# Patient Record
Sex: Female | Born: 1980 | Race: White | Hispanic: No | Marital: Single | Smoking: Never smoker
Health system: Southern US, Community
[De-identification: ages and names within clinical notes are randomized; demographics above are authoritative.]

## PROBLEM LIST (undated history)

## (undated) ENCOUNTER — Inpatient Hospital Stay (HOSPITAL_COMMUNITY): Payer: Self-pay

## (undated) DIAGNOSIS — R011 Cardiac murmur, unspecified: Secondary | ICD-10-CM

## (undated) DIAGNOSIS — N760 Acute vaginitis: Secondary | ICD-10-CM

## (undated) DIAGNOSIS — B9689 Other specified bacterial agents as the cause of diseases classified elsewhere: Secondary | ICD-10-CM

## (undated) HISTORY — PX: NO PAST SURGERIES: SHX2092

---

## 2014-08-28 ENCOUNTER — Emergency Department (HOSPITAL_COMMUNITY): Payer: Self-pay

## 2014-08-28 ENCOUNTER — Encounter (HOSPITAL_COMMUNITY): Payer: Self-pay | Admitting: Emergency Medicine

## 2014-08-28 ENCOUNTER — Emergency Department (HOSPITAL_COMMUNITY)
Admission: EM | Admit: 2014-08-28 | Discharge: 2014-08-29 | Disposition: A | Discharge status: Home / Self Care | Payer: Self-pay | Attending: Emergency Medicine | Admitting: Emergency Medicine

## 2014-08-28 DIAGNOSIS — B373 Candidiasis of vulva and vagina: Secondary | ICD-10-CM

## 2014-08-28 DIAGNOSIS — R000 Tachycardia, unspecified: Secondary | ICD-10-CM | POA: Insufficient documentation

## 2014-08-28 DIAGNOSIS — B379 Candidiasis, unspecified: Secondary | ICD-10-CM | POA: Insufficient documentation

## 2014-08-28 DIAGNOSIS — Z79899 Other long term (current) drug therapy: Secondary | ICD-10-CM | POA: Insufficient documentation

## 2014-08-28 DIAGNOSIS — B3731 Acute candidiasis of vulva and vagina: Secondary | ICD-10-CM

## 2014-08-28 DIAGNOSIS — B9689 Other specified bacterial agents as the cause of diseases classified elsewhere: Secondary | ICD-10-CM

## 2014-08-28 DIAGNOSIS — Z3202 Encounter for pregnancy test, result negative: Secondary | ICD-10-CM | POA: Insufficient documentation

## 2014-08-28 DIAGNOSIS — N760 Acute vaginitis: Secondary | ICD-10-CM | POA: Insufficient documentation

## 2014-08-28 DIAGNOSIS — N939 Abnormal uterine and vaginal bleeding, unspecified: Secondary | ICD-10-CM

## 2014-08-28 LAB — CBC
HCT: 41.7 % (ref 36.0–46.0)
Hemoglobin: 14.3 g/dL (ref 12.0–15.0)
MCH: 30.5 pg (ref 26.0–34.0)
MCHC: 34.3 g/dL (ref 30.0–36.0)
MCV: 88.9 fL (ref 78.0–100.0)
Platelets: 280 10*3/uL (ref 150–400)
RBC: 4.69 MIL/uL (ref 3.87–5.11)
RDW: 13.1 % (ref 11.5–15.5)
WBC: 10.6 10*3/uL — ABNORMAL HIGH (ref 4.0–10.5)

## 2014-08-28 LAB — URINE MICROSCOPIC-ADD ON

## 2014-08-28 LAB — WET PREP, GENITAL: TRICH WET PREP: NONE SEEN

## 2014-08-28 LAB — URINALYSIS, ROUTINE W REFLEX MICROSCOPIC
BILIRUBIN URINE: NEGATIVE
GLUCOSE, UA: NEGATIVE mg/dL
Hgb urine dipstick: NEGATIVE
KETONES UR: NEGATIVE mg/dL
NITRITE: NEGATIVE
PH: 6.5 (ref 5.0–8.0)
Protein, ur: NEGATIVE mg/dL
Specific Gravity, Urine: 1.033 — ABNORMAL HIGH (ref 1.005–1.030)
Urobilinogen, UA: 1 mg/dL (ref 0.0–1.0)

## 2014-08-28 LAB — BASIC METABOLIC PANEL
ANION GAP: 8 (ref 5–15)
BUN: 16 mg/dL (ref 6–23)
CALCIUM: 9.2 mg/dL (ref 8.4–10.5)
CO2: 22 mmol/L (ref 19–32)
CREATININE: 0.78 mg/dL (ref 0.50–1.10)
Chloride: 106 mEq/L (ref 96–112)
GFR calc Af Amer: >90 mL/min (ref >90)
GFR calc non Af Amer: >90 mL/min (ref >90)
GLUCOSE: 98 mg/dL (ref 70–99)
Potassium: 3.4 mmol/L — ABNORMAL LOW (ref 3.5–5.1)
SODIUM: 136 mmol/L (ref 135–145)

## 2014-08-28 LAB — I-STAT BETA HCG BLOOD, ED (MC, WL, AP ONLY): I-stat hCG, quantitative: <5 m[IU]/mL (ref <5)

## 2014-08-28 LAB — ABO/RH: ABO/RH(D): O NEG

## 2014-08-28 LAB — POC URINE PREG, ED: PREG TEST UR: NEGATIVE

## 2014-08-28 MED ORDER — FLUCONAZOLE 200 MG PO TABS
200.0000 mg | ORAL_TABLET | Freq: Once | ORAL | Status: AC
  Administered 2014-08-29: 200 mg via ORAL
  Filled 2014-08-28: qty 1

## 2014-08-28 MED ORDER — METRONIDAZOLE 1.3 % VA GEL: 5.0000 g | Freq: Every day | VAGINAL | Status: DC

## 2014-08-28 NOTE — ED Notes (Addendum)
Pt states she light cramping to her lower abd. Pt states since Saturday she has vaginal discharge when wiping. The discharge color changes at times from pink, red, to brown. Denies nausea, vomiting, or fever.

## 2014-08-28 NOTE — Discharge Instructions (Signed)
1. Medications: metronidazole, usual home medications 2. Treatment: rest, drink plenty of fluids,  3. Follow Up: Please followup with your primary doctor or the listed OB/GYN in 3 days for discussion of your diagnoses and further evaluation after today's visit; if you do not have a primary care doctor use the resource guide provided to find one; Please return to the ER for worsening symptoms   Monilial Vaginitis Vaginitis in a soreness, swelling and redness (inflammation) of the vagina and vulva. Monilial vaginitis is not a sexually transmitted infection. CAUSES  Yeast vaginitis is caused by yeast (candida) that is normally found in your vagina. With a yeast infection, the candida has overgrown in number to a point that upsets the chemical balance. SYMPTOMS   White, thick vaginal discharge.  Swelling, itching, redness and irritation of the vagina and possibly the lips of the vagina (vulva).  Burning or painful urination.  Painful intercourse. DIAGNOSIS  Things that may contribute to monilial vaginitis are:  Postmenopausal and virginal states.  Pregnancy.  Infections.  Being tired, sick or stressed, especially if you had monilial vaginitis in the past.  Diabetes. Good control will help lower the chance.  Birth control pills.  Tight fitting garments.  Using bubble bath, feminine sprays, douches or deodorant tampons.  Taking certain medications that kill germs (antibiotics).  Sporadic recurrence can occur if you become ill. TREATMENT  Your caregiver will give you medication.  There are several kinds of anti monilial vaginal creams and suppositories specific for monilial vaginitis. For recurrent yeast infections, use a suppository or cream in the vagina 2 times a week, or as directed.  Anti-monilial or steroid cream for the itching or irritation of the vulva may also be used. Get your caregiver's permission.  Painting the vagina with methylene blue solution may help if the  monilial cream does not work.  Eating yogurt may help prevent monilial vaginitis. HOME CARE INSTRUCTIONS   Finish all medication as prescribed.  Do not have sex until treatment is completed or after your caregiver tells you it is okay.  Take warm sitz baths.  Do not douche.  Do not use tampons, especially scented ones.  Wear cotton underwear.  Avoid tight pants and panty hose.  Tell your sexual partner that you have a yeast infection. They should go to their caregiver if they have symptoms such as mild rash or itching.  Your sexual partner should be treated as well if your infection is difficult to eliminate.  Practice safer sex. Use condoms.  Some vaginal medications cause latex condoms to fail. Vaginal medications that harm condoms are:  Cleocin cream.  Butoconazole (Femstat).  Terconazole (Terazol) vaginal suppository.  Miconazole (Monistat) (may be purchased over the counter). SEEK MEDICAL CARE IF:   You have a temperature by mouth above 102 F (38.9 C).  The infection is getting worse after 2 days of treatment.  The infection is not getting better after 3 days of treatment.  You develop blisters in or around your vagina.  You develop vaginal bleeding, and it is not your menstrual period.  You have pain when you urinate.  You develop intestinal problems.  You have pain with sexual intercourse. Document Released: 05/06/2005 Document Revised: 10/19/2011 Document Reviewed: 01/18/2009 [facility] Patient Information 2015 [place], [place]. This information is not intended to replace advice given to you by your health care provider. Make sure you discuss any questions you have with your health care provider.

## 2014-08-28 NOTE — ED Notes (Signed)
MD at bedside. 

## 2014-08-28 NOTE — ED Notes (Signed)
Pt states she took a pregnancy test this morning and it resulted positive  Pt states today she has had faint pink discharge from her vagina  Pt states she had the same a few days ago  Pt states she has chronic BV and pt states she is RH negative

## 2014-08-28 NOTE — ED Provider Notes (Signed)
CSN: [id number]     Arrival date & time 08/28/14  2137 History   First MD Initiated Contact with Patient 08/28/14 2201     Chief Complaint  Patient presents with  . Vaginal Bleeding     (Consider location/radiation/quality/duration/timing/severity/associated sxs/prior Treatment) The history is provided by the patient and medical records. No language interpreter was used.     Tammie Stephens is a 34 y.o. female  [phone] with no major medical history presents to the Emergency Department complaining of gradual, persistent, progressively worsening pink discharge onset this morning. Patient reports she has had similar symptoms intermittently for the last 3 days.. She reports that she took a pregnancy test this morning that resulted positive.   LMP: December 27th 2015 and was lighter than usual. Patient also reports that she has chronic bacterial vaginosis which waxes and wanes and occasionally causes vaginal bleeding. She reports that she has been using new scented body wash for several weeks and has noticed an increase in her vaginal discharge.  Patient denies abdominal pain, nausea, vomiting.     History reviewed. No pertinent past medical history. History reviewed. No pertinent past surgical history. History reviewed. No pertinent family history. History  Substance Use Topics  . Smoking status: Never Smoker   . Smokeless tobacco: Not on file  . Alcohol Use: No   OB History    No data available     Review of Systems  Constitutional: Negative for fever, diaphoresis, appetite change, fatigue and unexpected weight change.  HENT: Negative for mouth sores.   Eyes: Negative for visual disturbance.  Respiratory: Negative for cough, chest tightness, shortness of breath and wheezing.   Cardiovascular: Negative for chest pain.  Gastrointestinal: Negative for nausea, vomiting, abdominal pain, diarrhea and constipation.  Endocrine: Negative for polydipsia, polyphagia and polyuria.   Genitourinary: Positive for vaginal bleeding and vaginal discharge. Negative for dysuria, urgency, frequency and hematuria.  Musculoskeletal: Negative for back pain and neck stiffness.  Skin: Negative for rash.  Allergic/Immunologic: Negative for immunocompromised state.  Neurological: Negative for syncope, light-headedness and headaches.  Hematological: Does not bruise/bleed easily.  Psychiatric/Behavioral: Negative for sleep disturbance. The patient is not nervous/anxious.       Allergies  Review of patient's allergies indicates no known allergies.  Home Medications   Prior to Admission medications   Medication Sig Start Date End Date Taking? Authorizing Provider  Ascorbic Acid (VITAMIN C) 1000 MG tablet Take 1,000 mg by mouth daily.   Yes Historical Provider, MD  MetroNIDAZOLE 1.3 % GEL Place 5 g vaginally daily. 08/28/14   Hoby Kawai, PA-C   BP 129/79 mmHg  Pulse 103  Temp(Src) 98 F (36.7 C) (Oral)  Resp 18  SpO2 98%  LMP 07/31/2014 (Exact Date) Physical Exam  Constitutional: She appears well-developed and well-nourished. No distress.  Awake, alert, nontoxic appearance  HENT:  Head: Normocephalic and atraumatic.  Mouth/Throat: Oropharynx is clear and moist. No oropharyngeal exudate.  Eyes: Conjunctivae are normal. No scleral icterus.  Neck: Normal range of motion. Neck supple.  Cardiovascular: Regular rhythm, normal heart sounds and intact distal pulses.  Tachycardia present.   No murmur heard. Pulses:      Radial pulses are 2+ on the right side, and 2+ on the left side.  tachycardia  Pulmonary/Chest: Effort normal and breath sounds normal. No respiratory distress. She has no wheezes.  Equal chest expansion  Abdominal: Soft. Bowel sounds are normal. She exhibits no mass. There is no tenderness. There is no rebound and no guarding.  Hernia confirmed negative in the right inguinal area and confirmed negative in the left inguinal area.  Genitourinary: Uterus  normal. No labial fusion. There is no rash, tenderness or lesion on the right labia. There is no rash, tenderness or lesion on the left labia. Uterus is not deviated, not enlarged, not fixed and not tender. Cervix exhibits friability. Cervix exhibits no motion tenderness and no discharge. Right adnexum displays no mass, no tenderness and no fullness. Left adnexum displays no mass, no tenderness and no fullness. No erythema, tenderness or bleeding in the vagina. No foreign body around the vagina. No signs of injury around the vagina. Vaginal discharge ( thick, white, small, nonodorous) found.  Friability of the cervix with small amount of vaginal bleeding from the cervical os Cervical os appears closed No cervical motion tenderness  Musculoskeletal: Normal range of motion. She exhibits no edema.  Lymphadenopathy:       Right: No inguinal adenopathy present.       Left: No inguinal adenopathy present.  Neurological: She is alert.  Speech is clear and goal oriented Moves extremities without ataxia  Skin: Skin is warm and dry. She is not diaphoretic. No erythema.  Psychiatric: She has a normal mood and affect.  Nursing note and vitals reviewed.   ED Course  Procedures (including critical care time) Labs Review Labs Reviewed  WET PREP, GENITAL - Abnormal; Notable for the following:    Yeast Wet Prep HPF POC FEW (*)    Clue Cells Wet Prep HPF POC RARE (*)    WBC, Wet Prep HPF POC RARE (*)    All other components within normal limits  CBC - Abnormal; Notable for the following:    WBC 10.6 (*)    All other components within normal limits  BASIC METABOLIC PANEL - Abnormal; Notable for the following:    Potassium 3.4 (*)    All other components within normal limits  URINALYSIS, ROUTINE W REFLEX MICROSCOPIC - Abnormal; Notable for the following:    APPearance CLOUDY (*)    Specific Gravity, Urine 1.033 (*)    Leukocytes, UA MODERATE (*)    All other components within normal limits  URINE  MICROSCOPIC-ADD ON - Abnormal; Notable for the following:    Squamous Epithelial / LPF FEW (*)    Bacteria, UA MANY (*)    All other components within normal limits  POC URINE PREG, ED  I-STAT BETA HCG BLOOD, ED ([facility], [facility], [facility] ONLY)  ABO/RH  GC/CHLAMYDIA PROBE AMP ([place])    Imaging Review No results found.   EKG Interpretation None      MDM   Final diagnoses:  Vaginal bleeding  Vaginal spotting  Candidal vaginitis  BV (bacterial vaginosis)   Tammie Stephens presents with vaginal bleeding and positive home pregnancy test.  Pelvic exam with thick, clumping vaginal discharge consistent with yeast.  Cervix is friable and small amount of blood coming from the cervical os. Cervical os is closed. Will obtain labs and ultrasound.  1045:PM HCG negative. No need for ultrasound.  11:56 PM Patient pregnancy test negative. Both urine and blood. Discussed very low likelihood that patient may still be pregnant and the requirement for follow-up in 2 days at OB/GYN for repeat hCG.  Patient now abdominal pain in her abdomen is soft and nontender.  Labs are reassuring. Mild leukocytosis at 10.6. Wet prep with yeast and clue cells. We'll treat for yeast and BV as patient reports her symptoms are consistent with previous episodes of  BV. No evidence of STD and patient reports she is not at risk.  I have personally reviewed patient's vitals, nursing note and any pertinent labs or imaging.  I performed an undressed physical exam.    It has been determined that no acute conditions requiring further emergency intervention are present at this time. The patient/guardian have been advised of the diagnosis and plan. I reviewed all labs and imaging including any potential incidental findings. We have discussed signs and symptoms that warrant return to the ED and they are listed in the discharge instructions.    Vital signs are stable at discharge.   BP 129/79 mmHg  Pulse 103  Temp(Src) 98 F (36.7  C) (Oral)  Resp 18  SpO2 98%  LMP 07/31/2014 (Exact Date)         Dierdre Forth, PA-C 08/29/14 0003  Mirian Mo, MD 08/31/14 [phone]

## 2014-08-29 LAB — GC/CHLAMYDIA PROBE AMP (~~LOC~~) NOT AT ARMC
CHLAMYDIA, DNA PROBE: NEGATIVE
Neisseria Gonorrhea: NEGATIVE

## 2014-09-02 NOTE — Progress Notes (Signed)
09/02/2014 1609 p.m. Tammie Kluth SPARKS,Tammie Stephens,Tammie Stephens     EDCM called Robin back at [facility] at [phone] and gave verbal orders from Dr. Juleen ChinaKohut to change Metrodinazole cream to 0.75%. No further needs identified.  09/02/2014 1602 p.m. Tammie Sulewski SPARKS,Tammie Stephens,Tammie Stephens      Spoke with Dr. Juleen ChinaKohut regarding discharge orders of Metrodinazole 1.3% and [facility] request to change strength to 0.75%. Gave verbal orders to change Metrodinazole to 0.75% as requested.  09/02/2014 1553 p.m. Tammie Das SPARKS,Tammie Stephens,Tammie Stephens      Received call from Zella BallRobin at [facility] [phone]) regarding discharge instructions for Metrodinazole 1.3 %. States, "that strength is not an option, we only give 0.75%. Could we get an order to change it?" EDCM to talk with EDP and call [facility] back.

## 2015-11-02 ENCOUNTER — Encounter (HOSPITAL_COMMUNITY): Payer: Self-pay

## 2015-11-02 ENCOUNTER — Emergency Department (HOSPITAL_COMMUNITY)
Admission: EM | Admit: 2015-11-02 | Discharge: 2015-11-02 | Disposition: A | Discharge status: Home / Self Care | Payer: Self-pay | Attending: Emergency Medicine | Admitting: Emergency Medicine

## 2015-11-02 DIAGNOSIS — Z711 Person with feared health complaint in whom no diagnosis is made: Secondary | ICD-10-CM

## 2015-11-02 DIAGNOSIS — Z3202 Encounter for pregnancy test, result negative: Secondary | ICD-10-CM | POA: Insufficient documentation

## 2015-11-02 DIAGNOSIS — N898 Other specified noninflammatory disorders of vagina: Secondary | ICD-10-CM

## 2015-11-02 DIAGNOSIS — Z202 Contact with and (suspected) exposure to infections with a predominantly sexual mode of transmission: Secondary | ICD-10-CM | POA: Insufficient documentation

## 2015-11-02 HISTORY — DX: Acute vaginitis: N76.0

## 2015-11-02 HISTORY — DX: Other specified bacterial agents as the cause of diseases classified elsewhere: B96.89

## 2015-11-02 LAB — URINALYSIS, ROUTINE W REFLEX MICROSCOPIC
BILIRUBIN URINE: NEGATIVE
Glucose, UA: NEGATIVE mg/dL
HGB URINE DIPSTICK: NEGATIVE
KETONES UR: NEGATIVE mg/dL
Nitrite: NEGATIVE
PROTEIN: NEGATIVE mg/dL
Specific Gravity, Urine: 1.025 (ref 1.005–1.030)
pH: 6 (ref 5.0–8.0)

## 2015-11-02 LAB — WET PREP, GENITAL
Sperm: NONE SEEN
Trich, Wet Prep: NONE SEEN
Yeast Wet Prep HPF POC: NONE SEEN

## 2015-11-02 LAB — URINE MICROSCOPIC-ADD ON

## 2015-11-02 LAB — POC URINE PREG, ED: PREG TEST UR: NEGATIVE

## 2015-11-02 MED ORDER — METRONIDAZOLE 500 MG PO TABS: 500.0000 mg | ORAL_TABLET | Freq: Two times a day (BID) | ORAL | Status: DC

## 2015-11-02 MED ORDER — AZITHROMYCIN 250 MG PO TABS
1000.0000 mg | ORAL_TABLET | Freq: Once | ORAL | Status: AC
  Administered 2015-11-02: 1000 mg via ORAL
  Filled 2015-11-02: qty 4

## 2015-11-02 MED ORDER — LACTINEX PO CHEW: 1.0000 | CHEWABLE_TABLET | Freq: Three times a day (TID) | ORAL | Status: AC

## 2015-11-02 MED ORDER — CEFTRIAXONE SODIUM 250 MG IJ SOLR
250.0000 mg | Freq: Once | INTRAMUSCULAR | Status: AC
Start: 2015-11-02 — End: 2015-11-02
  Administered 2015-11-02: 250 mg via INTRAMUSCULAR
  Filled 2015-11-02: qty 250

## 2015-11-02 NOTE — ED Provider Notes (Signed)
CSN: [id number]     Arrival date & time 11/02/15  1138 History   First MD Initiated Contact with Patient 11/02/15 1157     Chief Complaint  Patient presents with  . Vaginal Pain    HPI   Tammie ErichsenVictoria Stephens is a 35 y.o. female with a PMH of BV who presents to the ED with vaginal irritation and vaginal discharge. She notes her symptoms started Wednesday and have been constant since that time. She reports she has been putting vitamin C tablets in her vagina to help with the smell. She denies exacerbating or alleviating factors. She denies abdominal pain, N/V, dysuria, urgency, frequency. She states she is in a long distance relationship and wants to make sure she does not have an STD.   Past Medical History  Diagnosis Date  . BV (bacterial vaginosis)    History reviewed. No pertinent past surgical history. History reviewed. No pertinent family history. Social History  Substance Use Topics  . Smoking status: Never Smoker   . Smokeless tobacco: None  . Alcohol Use: No   OB History    No data available      Review of Systems  Constitutional: Negative for fever and chills.  Gastrointestinal: Negative for nausea, vomiting and abdominal pain.  Genitourinary: Positive for vaginal discharge. Negative for dysuria, urgency, frequency and vaginal bleeding.  All other systems reviewed and are negative.     Allergies  Review of patient's allergies indicates no known allergies.  Home Medications   Prior to Admission medications   Medication Sig Start Date End Date Taking? Authorizing Provider  Ascorbic Acid (VITAMIN C) 1000 MG tablet Place 1,000 mg vaginally See admin instructions. Insert every 2 days   Yes Historical Provider, MD  lactobacillus acidophilus & bulgar (LACTINEX) chewable tablet Chew 1 tablet by mouth 3 (three) times daily with meals. 11/02/15   Mady GemmaElizabeth C Nataliee Shurtz, PA-C  metroNIDAZOLE (FLAGYL) 500 MG tablet Take 1 tablet (500 mg total) by mouth 2 (two) times daily. 11/02/15    Mady GemmaElizabeth C Margarethe Virgen, PA-C  MetroNIDAZOLE 1.3 % GEL Place 5 g vaginally daily. Patient not taking: Reported on 11/02/2015 08/28/14   Dahlia ClientHannah Muthersbaugh, PA-C    BP 108/91 mmHg  Pulse 66  Temp(Src) 98.2 F (36.8 C) (Oral)  Resp 18  SpO2 100%  LMP 10/19/2015 Physical Exam  Constitutional: She is oriented to person, place, and time. She appears well-developed and well-nourished. No distress.  HENT:  Head: Normocephalic and atraumatic.  Right Ear: External ear normal.  Left Ear: External ear normal.  Nose: Nose normal.  Mouth/Throat: Uvula is midline, oropharynx is clear and moist and mucous membranes are normal.  Eyes: Conjunctivae, EOM and lids are normal. Pupils are equal, round, and reactive to light. Right eye exhibits no discharge. Left eye exhibits no discharge. No scleral icterus.  Neck: Normal range of motion. Neck supple.  Cardiovascular: Normal rate, regular rhythm, normal heart sounds, intact distal pulses and normal pulses.   Pulmonary/Chest: Effort normal and breath sounds normal. No respiratory distress. She has no wheezes. She has no rales.  Abdominal: Soft. Normal appearance and bowel sounds are normal. She exhibits no distension and no mass. There is no tenderness. There is no rigidity, no rebound and no guarding.  Genitourinary: Uterus is not enlarged and not tender. Cervix exhibits no motion tenderness, no discharge and no friability. Right adnexum displays no mass, no tenderness and no fullness. Left adnexum displays no mass, no tenderness and no fullness. No erythema, tenderness or bleeding  in the vagina. No foreign body around the vagina. No signs of injury around the vagina. Vaginal discharge found.  Moderate amount of yellow-white discharge present in vaginal vault. No CMT of cervical friability. No adnexal or uterine tenderness.   Musculoskeletal: Normal range of motion. She exhibits no edema or tenderness.  Neurological: She is alert and oriented to person, place,  and time.  Skin: Skin is warm, dry and intact. No rash noted. She is not diaphoretic. No erythema. No pallor.  Psychiatric: She has a normal mood and affect. Her speech is normal and behavior is normal.  Nursing note and vitals reviewed.   ED Course  Procedures (including critical care time)  Labs Review Labs Reviewed  WET PREP, GENITAL - Abnormal; Notable for the following:    Clue Cells Wet Prep HPF POC PRESENT (*)    WBC, Wet Prep HPF POC MODERATE (*)    All other components within normal limits  URINALYSIS, ROUTINE W REFLEX MICROSCOPIC (NOT AT [facility]) - Abnormal; Notable for the following:    APPearance CLOUDY (*)    Leukocytes, UA MODERATE (*)    All other components within normal limits  URINE MICROSCOPIC-ADD ON - Abnormal; Notable for the following:    Squamous Epithelial / LPF 6-30 (*)    Bacteria, UA RARE (*)    All other components within normal limits  POC URINE PREG, ED  GC/CHLAMYDIA PROBE AMP ([place]) NOT AT [facility]    Imaging Review No results found.   I have personally reviewed and evaluated these lab results as part of my medical decision-making.   EKG Interpretation None      MDM   Final diagnoses:  Vaginal discharge  Concern about STD in female without diagnosis    35 year old female presents with vaginal irritation and vaginal discharge. Notes she has a history of BV, and her symptoms started Wednesday. Denies abdominal pain, N/V, dysuria, urgency, frequency. Patient is afebrile. Vital signs stable. Abdomen soft, non-tender, non-distended. Moderate amount of yellow-white discharge present in vaginal vault. No CMT of cervical friability. No adnexal or uterine tenderness. UA with moderate leukocytes, 0-5 WBC, rare bacteria. Do not feel antibiotic for UTI is indicated at this time. Wet prep with clue cells, moderate WBCs. Discussed findings with patient, who is requesting treatment for possible STD given symptoms and concern. Will treat for BV with flagyl  and for possible STD with rocephin and azithromycin. Patient understands she has GC/chlamydia cultures pending and that she will be called if results return positive (advised to inform all partners). Spoke with patient about importance of using protection while sexually active. Patient is non-toxic and well-appearing, feel she is stable for discharge at this time. Patient to follow-up with PCP. Strict return precautions discussed. Patient verbalizes her understanding and is in agreement with plan.       BP 108/91 mmHg  Pulse 66  Temp(Src) 98.2 F (36.8 C) (Oral)  Resp 18  SpO2 100%  LMP 10/19/2015     Mady Gemma, PA-C 11/02/15 1541  Melene Plan, DO 11/02/15 1550

## 2015-11-02 NOTE — Discharge Instructions (Signed)
1. Medications: flagyl (do not drink alcohol while taking this medication), usual home medications 2. Treatment: rest, drink plenty of fluids 3. Follow Up: please followup with your primary doctor for discussion of your diagnoses and further evaluation after today's visit; if you do not have a primary care doctor use the phone number listed in your discharge paperwork to find one; please return to the ER for high fever, severe pain, new or worsening symptoms

## 2015-11-02 NOTE — ED Notes (Signed)
Pt with repeat BV multiple times.  Pt heard home remedy of placing vitamin C in vagina via table.  Started 2 weeks ago and uses every 2 days.  Pt states it was helping.  Pain started getting worse again on Thursday.  Itching and discharge.  No longer burning with urination.

## 2015-11-04 LAB — GC/CHLAMYDIA PROBE AMP (~~LOC~~) NOT AT ARMC
Chlamydia: NEGATIVE
Neisseria Gonorrhea: NEGATIVE

## 2017-05-23 ENCOUNTER — Encounter (HOSPITAL_COMMUNITY): Payer: Self-pay

## 2017-05-23 ENCOUNTER — Inpatient Hospital Stay (HOSPITAL_COMMUNITY)
Admission: AD | Admit: 2017-05-23 | Discharge: 2017-05-23 | Disposition: A | Discharge status: Home / Self Care | Payer: Medicaid Other | Source: Ambulatory Visit | Attending: Obstetrics and Gynecology | Admitting: Obstetrics and Gynecology

## 2017-05-23 ENCOUNTER — Inpatient Hospital Stay (HOSPITAL_COMMUNITY): Payer: Medicaid Other

## 2017-05-23 DIAGNOSIS — Z79899 Other long term (current) drug therapy: Secondary | ICD-10-CM | POA: Insufficient documentation

## 2017-05-23 DIAGNOSIS — O209 Hemorrhage in early pregnancy, unspecified: Secondary | ICD-10-CM | POA: Insufficient documentation

## 2017-05-23 DIAGNOSIS — Z6791 Unspecified blood type, Rh negative: Secondary | ICD-10-CM | POA: Diagnosis not present

## 2017-05-23 DIAGNOSIS — Z3A01 Less than 8 weeks gestation of pregnancy: Secondary | ICD-10-CM | POA: Diagnosis not present

## 2017-05-23 DIAGNOSIS — N760 Acute vaginitis: Secondary | ICD-10-CM | POA: Diagnosis not present

## 2017-05-23 DIAGNOSIS — B9689 Other specified bacterial agents as the cause of diseases classified elsewhere: Secondary | ICD-10-CM | POA: Insufficient documentation

## 2017-05-23 DIAGNOSIS — O26891 Other specified pregnancy related conditions, first trimester: Secondary | ICD-10-CM

## 2017-05-23 HISTORY — DX: Cardiac murmur, unspecified: R01.1

## 2017-05-23 LAB — URINALYSIS, ROUTINE W REFLEX MICROSCOPIC
Bilirubin Urine: NEGATIVE
GLUCOSE, UA: NEGATIVE mg/dL
Hgb urine dipstick: NEGATIVE
KETONES UR: NEGATIVE mg/dL
Leukocytes, UA: NEGATIVE
NITRITE: NEGATIVE
PROTEIN: NEGATIVE mg/dL
Specific Gravity, Urine: 1.024 (ref 1.005–1.030)
pH: 5 (ref 5.0–8.0)

## 2017-05-23 LAB — CBC
HEMATOCRIT: 38.7 % (ref 36.0–46.0)
HEMOGLOBIN: 13.5 g/dL (ref 12.0–15.0)
MCH: 31.4 pg (ref 26.0–34.0)
MCHC: 34.9 g/dL (ref 30.0–36.0)
MCV: 90 fL (ref 78.0–100.0)
Platelets: 253 10*3/uL (ref 150–400)
RBC: 4.3 MIL/uL (ref 3.87–5.11)
RDW: 13.3 % (ref 11.5–15.5)
WBC: 10.6 10*3/uL — AB (ref 4.0–10.5)

## 2017-05-23 LAB — WET PREP, GENITAL
SPERM: NONE SEEN
Trich, Wet Prep: NONE SEEN
YEAST WET PREP: NONE SEEN

## 2017-05-23 LAB — HCG, QUANTITATIVE, PREGNANCY: hCG, Beta Chain, Quant, S: 7715 m[IU]/mL — ABNORMAL HIGH (ref <5)

## 2017-05-23 LAB — POCT PREGNANCY, URINE: PREG TEST UR: POSITIVE — AB

## 2017-05-23 MED ORDER — RHO D IMMUNE GLOBULIN 1500 UNIT/2ML IJ SOSY
300.0000 ug | PREFILLED_SYRINGE | Freq: Once | INTRAMUSCULAR | Status: AC
  Administered 2017-05-23: 300 ug via INTRAMUSCULAR
  Filled 2017-05-23: qty 2

## 2017-05-23 MED ORDER — METRONIDAZOLE 500 MG PO TABS: 500.0000 mg | ORAL_TABLET | Freq: Two times a day (BID) | ORAL | 0 refills | Status: AC

## 2017-05-23 NOTE — Discharge Instructions (Signed)
First Trimester of Pregnancy The first trimester of pregnancy is from week 1 until the end of week 13 (months 1 through 3). A week after a sperm fertilizes an egg, the egg will implant on the wall of the uterus. This embryo will begin to develop into a baby. Genes from you and your partner will form the baby. The female genes will determine whether the baby will be a boy or a girl. At 6-8 weeks, the eyes and face will be formed, and the heartbeat can be seen on ultrasound. At the end of 12 weeks, all the baby's organs will be formed. Now that you are pregnant, you will want to do everything you can to have a healthy baby. Two of the most important things are to get good prenatal care and to follow your health care provider's instructions. Prenatal care is all the medical care you receive before the baby's birth. This care will help prevent, find, and treat any problems during the pregnancy and childbirth. Body changes during your first trimester Your body goes through many changes during pregnancy. The changes vary from woman to woman.  You may gain or lose a couple of pounds at first.  You may feel sick to your stomach (nauseous) and you may throw up (vomit). If the vomiting is uncontrollable, call your health care provider.  You may tire easily.  You may develop headaches that can be relieved by medicines. All medicines should be approved by your health care provider.  You may urinate more often. Painful urination may mean you have a bladder infection.  You may develop heartburn as a result of your pregnancy.  You may develop constipation because certain hormones are causing the muscles that push stool through your intestines to slow down.  You may develop hemorrhoids or swollen veins (varicose veins).  Your breasts may begin to grow larger and become tender. Your nipples may stick out more, and the tissue that surrounds them (areola) may become darker.  Your gums may bleed and may be  sensitive to brushing and flossing.  Dark spots or blotches (chloasma, mask of pregnancy) may develop on your face. This will likely fade after the baby is born.  Your menstrual periods will stop.  You may have a loss of appetite.  You may develop cravings for certain kinds of food.  You may have changes in your emotions from day to day, such as being excited to be pregnant or being concerned that something may go wrong with the pregnancy and baby.  You may have more vivid and strange dreams.  You may have changes in your hair. These can include thickening of your hair, rapid growth, and changes in texture. Some women also have hair loss during or after pregnancy, or hair that feels dry or thin. Your hair will most likely return to normal after your baby is born.  What to expect at prenatal visits During a routine prenatal visit:  You will be weighed to make sure you and the baby are growing normally.  Your blood pressure will be taken.  Your abdomen will be measured to track your baby's growth.  The fetal heartbeat will be listened to between weeks 10 and 14 of your pregnancy.  Test results from any previous visits will be discussed.  Your health care provider may ask you:  How you are feeling.  If you are feeling the baby move.  If you have had any abnormal symptoms, such as leaking fluid, bleeding, severe headaches,   or abdominal cramping.  If you are using any tobacco products, including cigarettes, chewing tobacco, and electronic cigarettes.  If you have any questions.  Other tests that may be performed during your first trimester include:  Blood tests to find your blood type and to check for the presence of any previous infections. The tests will also be used to check for low iron levels (anemia) and protein on red blood cells (Rh antibodies). Depending on your risk factors, or if you previously had diabetes during pregnancy, you may have tests to check for high blood  sugar that affects pregnant women (gestational diabetes).  Urine tests to check for infections, diabetes, or protein in the urine.  An ultrasound to confirm the proper growth and development of the baby.  Fetal screens for spinal cord problems (spina bifida) and Down syndrome.  HIV (human immunodeficiency virus) testing. Routine prenatal testing includes screening for HIV, unless you choose not to have this test.  You may need other tests to make sure you and the baby are doing well.  Follow these instructions at home: Medicines  Follow your health care provider's instructions regarding medicine use. Specific medicines may be either safe or unsafe to take during pregnancy.  Take a prenatal vitamin that contains at least 600 micrograms (mcg) of folic acid.  If you develop constipation, try taking a stool softener if your health care provider approves. Eating and drinking  Eat a balanced diet that includes fresh fruits and vegetables, whole grains, good sources of protein such as meat, eggs, or tofu, and low-fat dairy. Your health care provider will help you determine the amount of weight gain that is right for you.  Avoid raw meat and uncooked cheese. These carry germs that can cause birth defects in the baby.  Eating four or five small meals rather than three large meals a day may help relieve nausea and vomiting. If you start to feel nauseous, eating a few soda crackers can be helpful. Drinking liquids between meals, instead of during meals, also seems to help ease nausea and vomiting.  Limit foods that are high in fat and processed sugars, such as fried and sweet foods.  To prevent constipation: ? Eat foods that are high in fiber, such as fresh fruits and vegetables, whole grains, and beans. ? Drink enough fluid to keep your urine clear or pale yellow. Activity  Exercise only as directed by your health care provider. Most women can continue their usual exercise routine during  pregnancy. Try to exercise for 30 minutes at least 5 days a week. Exercising will help you: ? Control your weight. ? Stay in shape. ? Be prepared for labor and delivery.  Experiencing pain or cramping in the lower abdomen or lower back is a good sign that you should stop exercising. Check with your health care provider before continuing with normal exercises.  Try to avoid standing for long periods of time. Move your legs often if you must stand in one place for a long time.  Avoid heavy lifting.  Wear low-heeled shoes and practice good posture.  You may continue to have sex unless your health care provider tells you not to. Relieving pain and discomfort  Wear a good support bra to relieve breast tenderness.  Take warm sitz baths to soothe any pain or discomfort caused by hemorrhoids. Use hemorrhoid cream if your health care provider approves.  Rest with your legs elevated if you have leg cramps or low back pain.  If you develop   varicose veins in your legs, wear support hose. Elevate your feet for 15 minutes, 3-4 times a day. Limit salt in your diet. Prenatal care  Schedule your prenatal visits by the twelfth week of pregnancy. They are usually scheduled monthly at first, then more often in the last 2 months before delivery.  Write down your questions. Take them to your prenatal visits.  Keep all your prenatal visits as told by your health care provider. This is important. Safety  Wear your seat belt at all times when driving.  Make a list of emergency phone numbers, including numbers for family, friends, the hospital, and police and fire departments. General instructions  Ask your health care provider for a referral to a local prenatal education class. Begin classes no later than the beginning of month 6 of your pregnancy.  Ask for help if you have counseling or nutritional needs during pregnancy. Your health care provider can offer advice or refer you to specialists for help  with various needs.  Do not use hot tubs, steam rooms, or saunas.  Do not douche or use tampons or scented sanitary pads.  Do not cross your legs for long periods of time.  Avoid cat litter boxes and soil used by cats. These carry germs that can cause birth defects in the baby and possibly loss of the fetus by miscarriage or stillbirth.  Avoid all smoking, herbs, alcohol, and medicines not prescribed by your health care provider. Chemicals in these products affect the formation and growth of the baby.  Do not use any products that contain nicotine or tobacco, such as cigarettes and e-cigarettes. If you need help quitting, ask your health care provider. You may receive counseling support and other resources to help you quit.  Schedule a dentist appointment. At home, brush your teeth with a soft toothbrush and be gentle when you floss. Contact a health care provider if:  You have dizziness.  You have mild pelvic cramps, pelvic pressure, or nagging pain in the abdominal area.  You have persistent nausea, vomiting, or diarrhea.  You have a bad smelling vaginal discharge.  You have pain when you urinate.  You notice increased swelling in your face, hands, legs, or ankles.  You are exposed to fifth disease or chickenpox.  You are exposed to German measles (rubella) and have never had it. Get help right away if:  You have a fever.  You are leaking fluid from your vagina.  You have spotting or bleeding from your vagina.  You have severe abdominal cramping or pain.  You have rapid weight gain or loss.  You vomit blood or material that looks like coffee grounds.  You develop a severe headache.  You have shortness of breath.  You have any kind of trauma, such as from a fall or a car accident. Summary  The first trimester of pregnancy is from week 1 until the end of week 13 (months 1 through 3).  Your body goes through many changes during pregnancy. The changes vary from  woman to woman.  You will have routine prenatal visits. During those visits, your health care provider will examine you, discuss any test results you may have, and talk with you about how you are feeling. This information is not intended to replace advice given to you by your health care provider. Make sure you discuss any questions you have with your health care provider. Document Released: 07/21/2001 Document Revised: 07/08/2016 Document Reviewed: 07/08/2016 Elsevier Interactive Patient Education  2017 [employer]  

## 2017-05-23 NOTE — MAU Note (Signed)
Spotting beginning at 0330.  Pink tinge to it, did not notice any when giving UA in MAU.  Also having light period cramps since yesterday morning.  LMP 04/16/2017

## 2017-05-23 NOTE — MAU Provider Note (Signed)
History     CSN: [id number]  Arrival date and time: 05/23/17 4098   First Provider Initiated Contact with Patient 05/23/17 0520      Chief Complaint  Patient presents with  . Vaginal Bleeding  . Abdominal Pain   HPI   Tammie Stephens is a 36 y.o. female G4P2010 at [redacted]w[redacted]d here In MAU with vaginal bleeding. The bleeding started tonight. She noticed it when she got up to use the bathroom. She noticed it only when she wipes. She only saw the bleeding X 1. She is also having very light menstrual like cramping. The pain comes and goes. She has not taken any over the counter medications. No recent intercourse.   OB History    Gravida Para Term Preterm AB Living   SAB TAB Ectopic Multiple Live Births     1            Past Medical History:  Diagnosis Date  . BV (bacterial vaginosis)   . Heart murmur     Past Surgical History:  Procedure Laterality Date  . NO PAST SURGERIES      No family history on file.  Social History  Substance Use Topics  . Smoking status: Never Smoker  . Smokeless tobacco: Not on file  . Alcohol use No    Allergies: No Known Allergies  Prescriptions Prior to Admission  Medication Sig Dispense Refill Last Dose  . Prenatal Vit-Fe Fumarate-FA (PRENATAL MULTIVITAMIN) TABS tablet Take 1 tablet by mouth daily at 12 noon.   Past Week at Unknown time  . Ascorbic Acid (VITAMIN C) 1000 MG tablet Place 1,000 mg vaginally See admin instructions. Insert every 2 days   Past Week at Unknown time  . lactobacillus acidophilus & bulgar (LACTINEX) chewable tablet Chew 1 tablet by mouth 3 (three) times daily with meals. 90 tablet 0   . metroNIDAZOLE (FLAGYL) 500 MG tablet Take 1 tablet (500 mg total) by mouth 2 (two) times daily. 20 tablet 0   . MetroNIDAZOLE 1.3 % GEL Place 5 g vaginally daily. (Patient not taking: Reported on 11/02/2015) 25 g 0    Results for orders placed or performed during the hospital encounter of 05/23/17 (from the past 48  hour(s))  Urinalysis, Routine w reflex microscopic     Status: None   Collection Time: 05/23/17  4:44 AM  Result Value Ref Range   Color, Urine YELLOW YELLOW   APPearance CLEAR CLEAR   Specific Gravity, Urine 1.024 1.005 - 1.030   pH 5.0 5.0 - 8.0   Glucose, UA NEGATIVE NEGATIVE mg/dL   Hgb urine dipstick NEGATIVE NEGATIVE   Bilirubin Urine NEGATIVE NEGATIVE   Ketones, ur NEGATIVE NEGATIVE mg/dL   Protein, ur NEGATIVE NEGATIVE mg/dL   Nitrite NEGATIVE NEGATIVE   Leukocytes, UA NEGATIVE NEGATIVE  Pregnancy, urine POC     Status: Abnormal   Collection Time: 05/23/17  4:53 AM  Result Value Ref Range   Preg Test, Ur POSITIVE (A) NEGATIVE    Comment:        THE SENSITIVITY OF THIS METHODOLOGY IS >24 mIU/mL   Wet prep, genital     Status: Abnormal   Collection Time: 05/23/17  5:28 AM  Result Value Ref Range   Yeast Wet Prep HPF POC NONE SEEN NONE SEEN   Trich, Wet Prep NONE SEEN NONE SEEN   Clue Cells Wet Prep HPF POC PRESENT (A) NONE SEEN   WBC, Wet Prep  HPF POC MANY (A) NONE SEEN    Comment: MANY BACTERIA SEEN   Sperm NONE SEEN   CBC     Status: Abnormal   Collection Time: 05/23/17  5:46 AM  Result Value Ref Range   WBC 10.6 (H) 4.0 - 10.5 K/uL   RBC 4.30 3.87 - 5.11 MIL/uL   Hemoglobin 13.5 12.0 - 15.0 g/dL   HCT 16.1 09.6 - 04.5 %   MCV 90.0 78.0 - 100.0 fL   MCH 31.4 26.0 - 34.0 pg   MCHC 34.9 30.0 - 36.0 g/dL   RDW 40.9 81.1 - 91.4 %   Platelets 253 150 - 400 K/uL  hCG, quantitative, pregnancy     Status: Abnormal   Collection Time: 05/23/17  5:46 AM  Result Value Ref Range   hCG, Beta Chain, Quant, S 7,715 (H) <5 mIU/mL    Comment:          GEST. AGE      CONC.  (mIU/mL)   <=1 WEEK        5 - 50     2 WEEKS       50 - 500     3 WEEKS       100 - 10,000     4 WEEKS     1,000 - 30,000     5 WEEKS     3,500 - 115,000   6-8 WEEKS     12,000 - 270,000    12 WEEKS     15,000 - 220,000        FEMALE AND NON-PREGNANT FEMALE:     LESS THAN 5 mIU/mL   Rh IG workup  (includes ABO/Rh)     Status: None (Preliminary result)   Collection Time: 05/23/17  5:46 AM  Result Value Ref Range   Gestational Age(Wks) 5    ABO/RH(D) O NEG    Antibody Screen NEG    Unit Number [id number]    Blood Component Type RHIG    Unit division 00    Status of Unit ISSUED    Transfusion Status OK TO TRANSFUSE    US Ob Comp Less 14 Wks  Result Date: 05/23/2017 CLINICAL DATA:  Acute onset of vaginal bleeding.  Initial encounter. EXAM: OBSTETRIC <14 WK [place] AND TRANSVAGINAL OB US TECHNIQUE: Both transabdominal and transvaginal ultrasound examinations were performed for complete evaluation of the gestation as well as the maternal uterus, adnexal regions, and pelvic cul-de-sac. Transvaginal technique was performed to assess early pregnancy. COMPARISON:  None. FINDINGS: Intrauterine gestational sac: Single; visualized and normal in shape. Yolk sac:  Yes Embryo:  No Cardiac Activity: N/A MSD: 7.2 mm   5 w   3  d Subchorionic hemorrhage:  None visualized. Maternal uterus/adnexae: The uterus is otherwise unremarkable. The ovaries are within normal limits. The right ovary measures 3.3 x 3.1 x 2.3 cm, while the left ovary measures 2.2 x 1.3 x 1.5 cm. No suspicious adnexal masses are seen; there is no evidence for ovarian torsion. No free fluid is seen within the pelvic cul-de-sac. IMPRESSION: Single intrauterine gestational sac noted, with a mean sac diameter of 7 mm, corresponding to a gestational age of [redacted] weeks 3 days. This matches the gestational age of [redacted] weeks 2 days by LMP, reflecting an estimated date of delivery of January 21, 2018. A yolk sac is visualized. No embryo is yet seen, still within normal limits. Electronically Signed   By: Roanna Raider M.D.   On: 05/23/2017 06:41  US Ob Transvaginal  Result Date: 05/23/2017 CLINICAL DATA:  Acute onset of vaginal bleeding.  Initial encounter. EXAM: OBSTETRIC <14 WK [place] AND TRANSVAGINAL OB US TECHNIQUE: Both transabdominal and transvaginal  ultrasound examinations were performed for complete evaluation of the gestation as well as the maternal uterus, adnexal regions, and pelvic cul-de-sac. Transvaginal technique was performed to assess early pregnancy. COMPARISON:  None. FINDINGS: Intrauterine gestational sac: Single; visualized and normal in shape. Yolk sac:  Yes Embryo:  No Cardiac Activity: N/A MSD: 7.2 mm   5 w   3  d Subchorionic hemorrhage:  None visualized. Maternal uterus/adnexae: The uterus is otherwise unremarkable. The ovaries are within normal limits. The right ovary measures 3.3 x 3.1 x 2.3 cm, while the left ovary measures 2.2 x 1.3 x 1.5 cm. No suspicious adnexal masses are seen; there is no evidence for ovarian torsion. No free fluid is seen within the pelvic cul-de-sac. IMPRESSION: Single intrauterine gestational sac noted, with a mean sac diameter of 7 mm, corresponding to a gestational age of [redacted] weeks 3 days. This matches the gestational age of [redacted] weeks 2 days by LMP, reflecting an estimated date of delivery of January 21, 2018. A yolk sac is visualized. No embryo is yet seen, still within normal limits. Electronically Signed   By: Roanna Raider M.D.   On: 05/23/2017 06:41   Review of Systems  Gastrointestinal: Positive for abdominal pain. Negative for nausea and vomiting.  Genitourinary: Positive for vaginal bleeding. Negative for dysuria.   Physical Exam   Blood pressure 115/71, pulse 79, temperature 98.3 F (36.8 C), resp. rate 17, height  (1.626 m), weight 91.6 kg (202 lb), last menstrual period 04/16/2017.  Physical Exam  Constitutional: She is oriented to person, place, and time. She appears well-developed and well-nourished. No distress.  HENT:  Head: Normocephalic.  Eyes: Pupils are equal, round, and reactive to light.  Respiratory: Effort normal.  GI: Soft. She exhibits no distension. There is no tenderness. There is no rebound.  Genitourinary:  Genitourinary Comments: Vagina - Small amount of white  vaginal discharge, no odor  Cervix - No contact bleeding, no active bleeding  Bimanual exam: Cervix closed Uterus non tender, normal size Adnexa non tender, no masses bilaterally GC/Chlam, wet prep done Chaperone present for exam.   Musculoskeletal: Normal range of motion.  Neurological: She is alert and oriented to person, place, and time.  Skin: Skin is warm. She is not diaphoretic.  Psychiatric: Her behavior is normal.   MAU Course  Procedures  None  MDM  Wet prep & GC HIV, CBC, Hcg, ABO US OB transvaginal  O negative blood type: Rhogam given tonight in MAU.   Assessment and Plan   A:  1. Vaginal bleeding in pregnancy, first trimester   2. Rh negative state in antepartum period, first trimester   3. BV (bacterial vaginosis)     P:  Discharge home in stable condition Bleeding precautions  Start prenatal care Rx: Flagyl  Prenatal vitamins daily Bleeding precautions  Rasch, Harolyn Rutherford, NP 05/24/2017 8:40 AM

## 2017-05-24 LAB — RH IG WORKUP (INCLUDES ABO/RH)
ABO/RH(D): O NEG
ANTIBODY SCREEN: NEGATIVE
GESTATIONAL AGE(WKS): 5
Unit division: 0

## 2017-05-24 LAB — GC/CHLAMYDIA PROBE AMP (~~LOC~~) NOT AT ARMC
CHLAMYDIA, DNA PROBE: POSITIVE — AB
NEISSERIA GONORRHEA: NEGATIVE

## 2017-05-24 LAB — HIV ANTIBODY (ROUTINE TESTING W REFLEX): HIV Screen 4th Generation wRfx: NONREACTIVE

## 2017-05-25 ENCOUNTER — Telehealth: Payer: Self-pay | Admitting: Family Medicine

## 2017-05-25 MED ORDER — AZITHROMYCIN 250 MG PO TABS: 1000.0000 mg | ORAL_TABLET | Freq: Once | ORAL | 0 refills | Status: AC

## 2017-05-25 NOTE — Telephone Encounter (Signed)
Left voicemail asking pt to return call and provider which pharmacy she would like prescription sent to

## 2017-05-25 NOTE — Telephone Encounter (Signed)
Need a Rx called in for Chlamydia.

## 2017-05-25 NOTE — Telephone Encounter (Signed)
Patient called and left message stating she saw in mychart she has an STD and wants to know what treatment she needs. Called patient, no answer- left message stating I am returning your phone call. Sorry to have missed you but I will send you a mychart message to answer your questions

## 2017-07-08 ENCOUNTER — Encounter: Payer: Self-pay | Admitting: Student

## 2018-04-15 ENCOUNTER — Encounter (HOSPITAL_COMMUNITY): Payer: Self-pay

## 2018-07-31 IMAGING — US US OB COMP LESS 14 WK
1 series · 15 of 28 positions shown · non-contrast
Comparison: None.

CLINICAL DATA: Acute onset of vaginal bleeding.  Initial encounter.

EXAM:
OBSTETRIC <14 WK US AND TRANSVAGINAL OB US
TECHNIQUE: Both transabdominal and transvaginal ultrasound examinations were
performed for complete evaluation of the gestation as well as the
maternal uterus, adnexal regions, and pelvic cul-de-sac.
Transvaginal technique was performed to assess early pregnancy.

[Series 1: us ob comp less 14 wk · 15 of 59 slices shown]
[im 1/59]
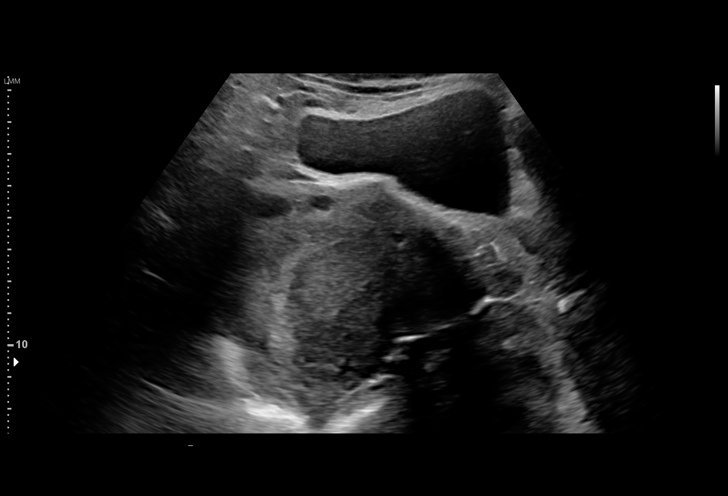
[im 5/59]
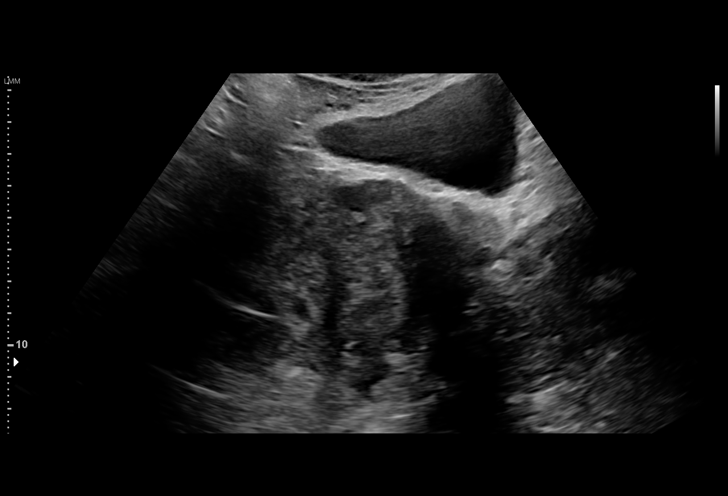
[im 9/59]
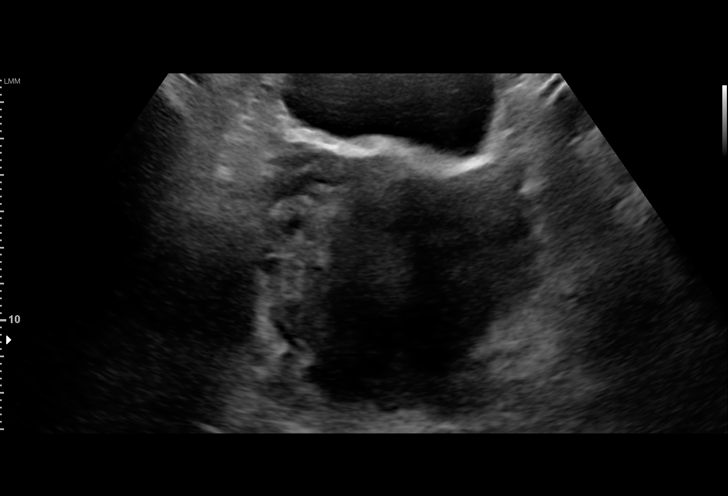
[im 13/59]
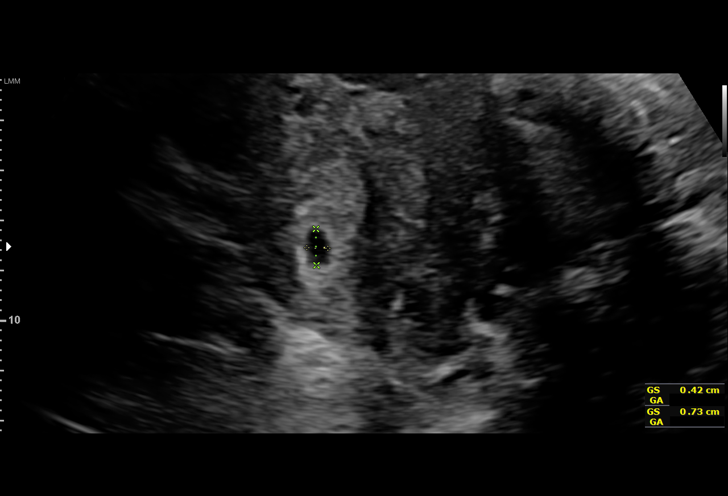
[im 18/59]
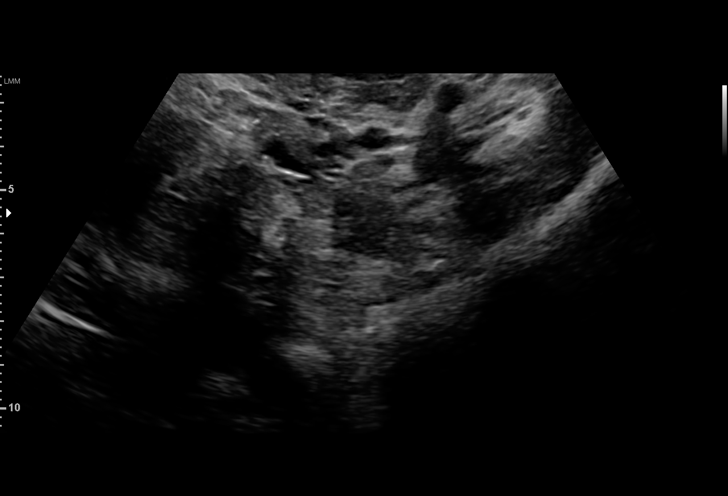
[im 22/59]
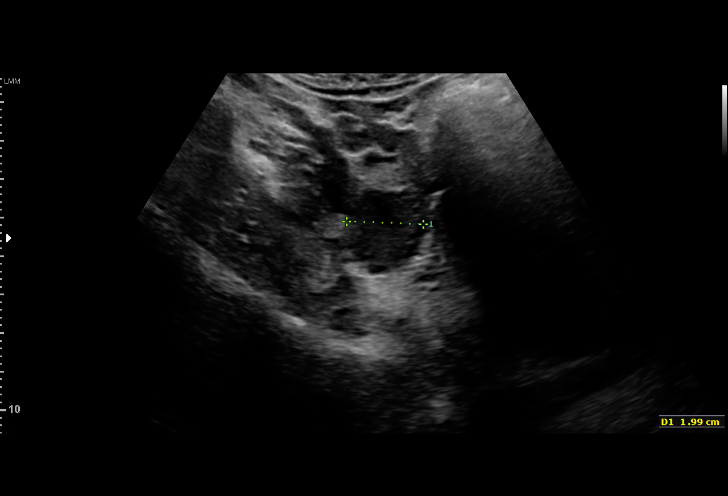
[im 26/59]
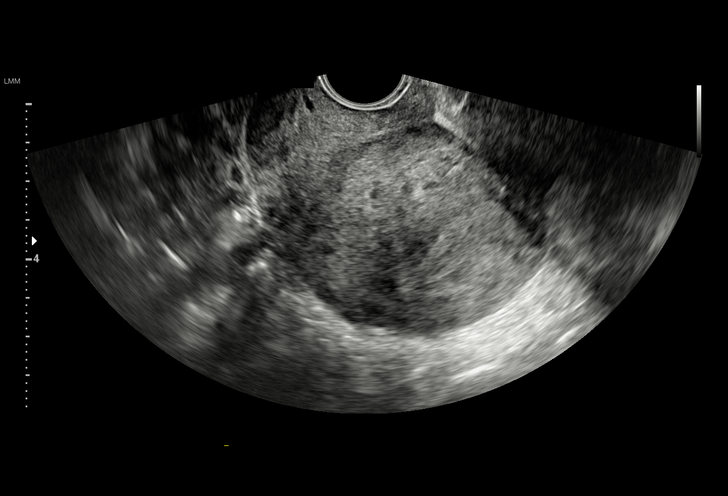
[im 31/59]
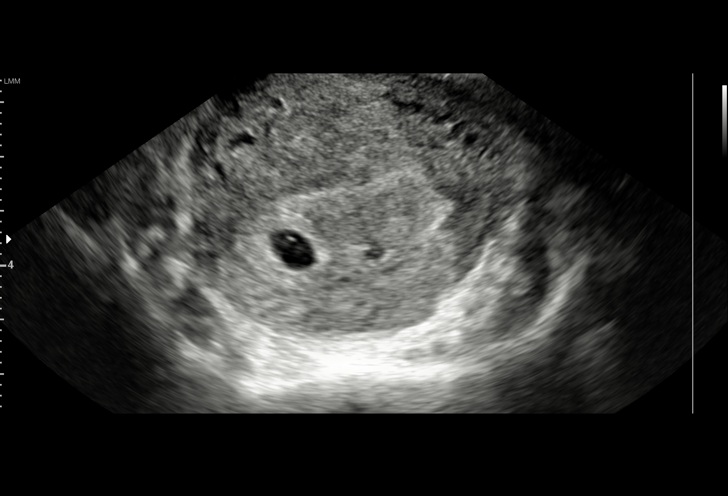
[im 33/59]
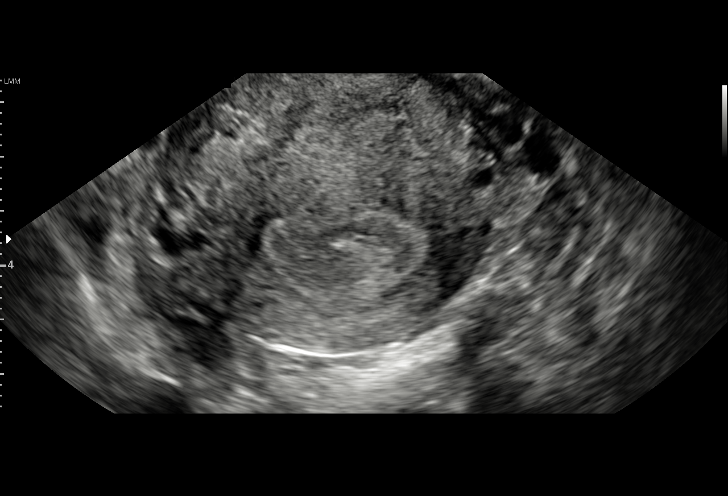
[im 37/59]
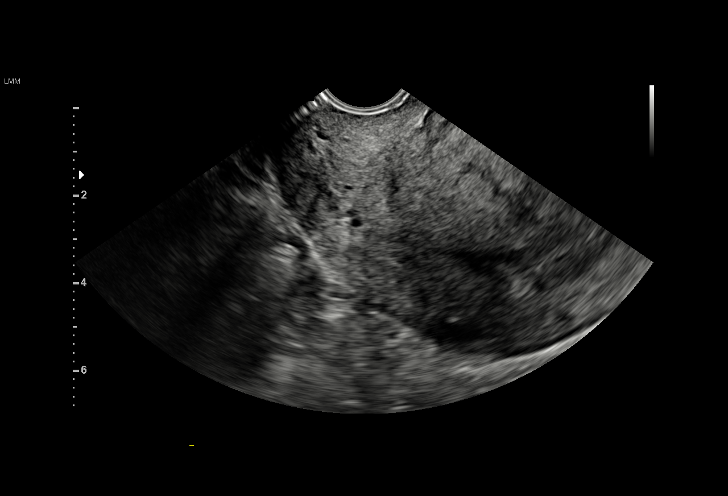
[im 41/59]
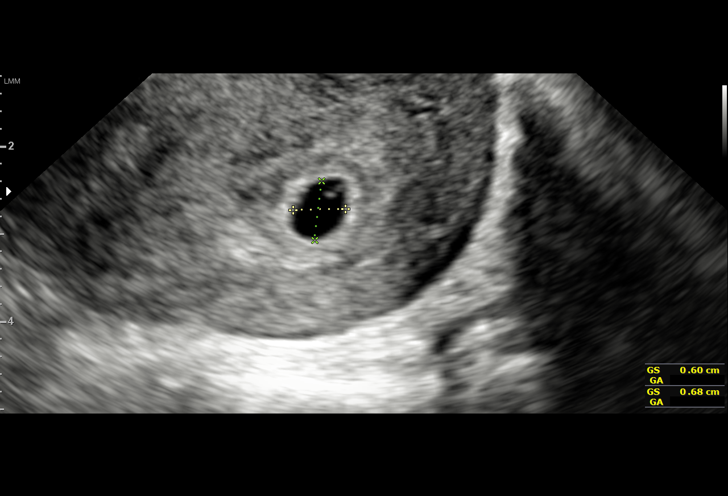
[im 46/59]
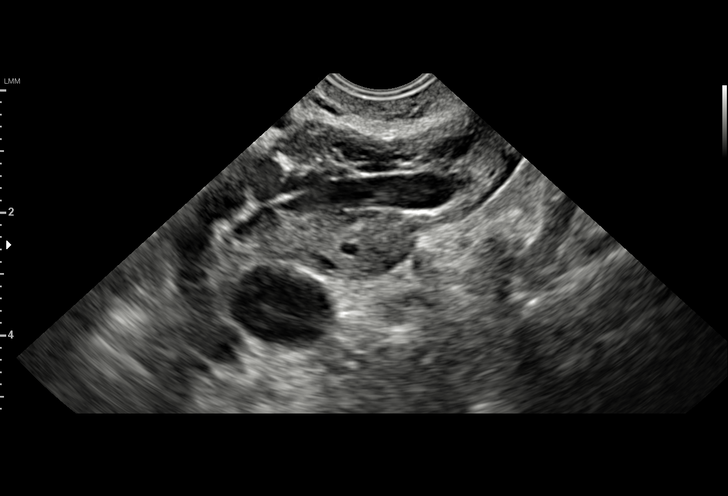
[im 50/59]
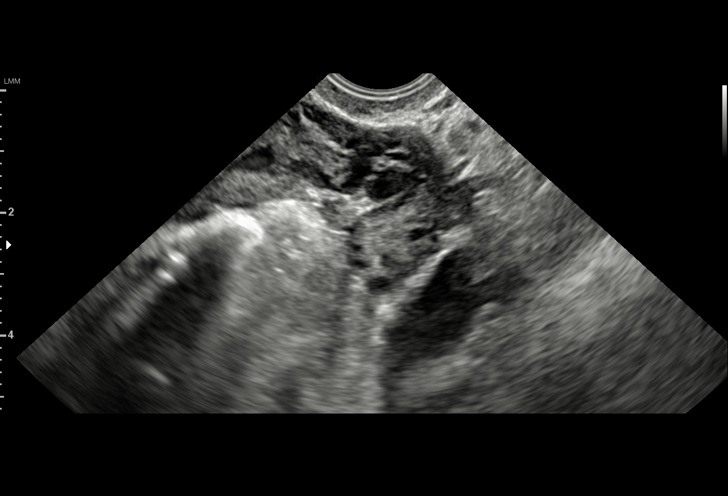
[im 54/59]
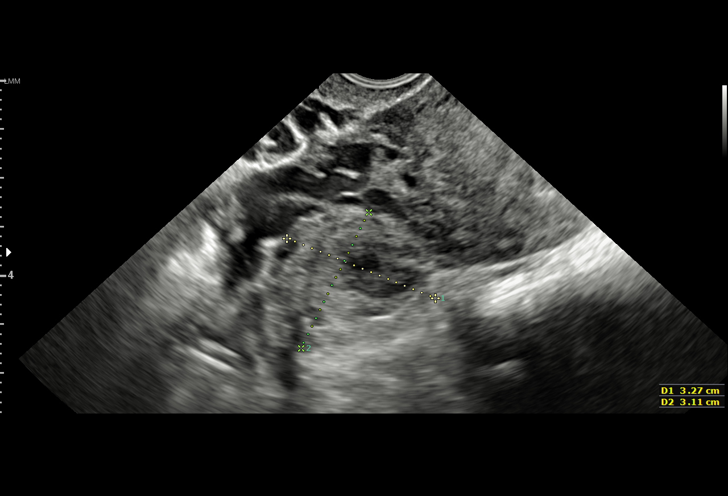
[im 59/59]
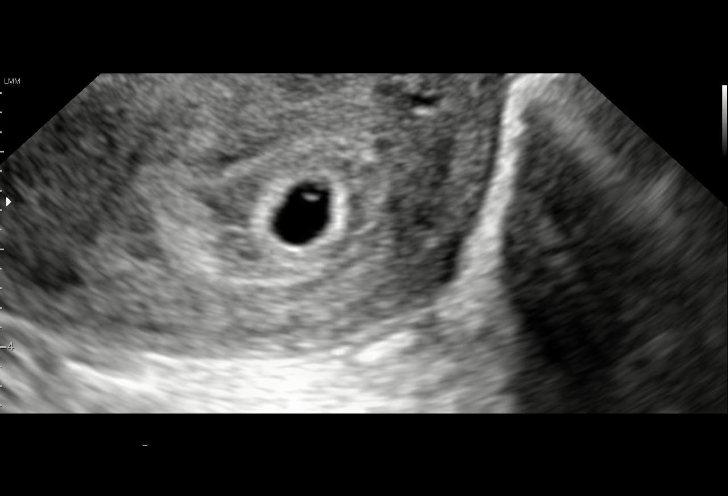

[15 of 28 positions shown; findings below may reference images not displayed]

FINDINGS: Intrauterine gestational sac: Single; visualized and normal in
shape.

Yolk sac:  Yes

Embryo:  No

Cardiac Activity: N/A

MSD: 7.2 mm   5 w   3  d

Subchorionic hemorrhage:  None visualized.

Maternal uterus/adnexae: The uterus is otherwise unremarkable.

The ovaries are within normal limits. The right ovary measures 3.3 x
3.1 x 2.3 cm, while the left ovary measures 2.2 x 1.3 x 1.5 cm. No
suspicious adnexal masses are seen; there is no evidence for ovarian
torsion.

No free fluid is seen within the pelvic cul-de-sac.
IMPRESSION: Single intrauterine gestational sac noted, with a mean sac diameter
of 7 mm, corresponding to a gestational age of 5 weeks 3 days. This
matches the gestational age of 5 weeks 2 days by LMP, reflecting an
estimated date of delivery January 21, 2018. A yolk sac is
visualized. No embryo is yet seen, still within normal limits.

## 2021-12-30 ENCOUNTER — Other Ambulatory Visit (HOSPITAL_COMMUNITY): Payer: Self-pay

## 2023-02-17 ENCOUNTER — Other Ambulatory Visit (HOSPITAL_COMMUNITY): Payer: Self-pay
